# Patient Record
Sex: Male | Born: 1985 | Race: White | Hispanic: No | Marital: Single | Smoking: Current every day smoker
Health system: Southern US, Community
[De-identification: ages and names within clinical notes are randomized; demographics above are authoritative.]

## PROBLEM LIST (undated history)

## (undated) DIAGNOSIS — F1910 Other psychoactive substance abuse, uncomplicated: Secondary | ICD-10-CM

## (undated) DIAGNOSIS — F410 Panic disorder [episodic paroxysmal anxiety] without agoraphobia: Secondary | ICD-10-CM

## (undated) DIAGNOSIS — F1010 Alcohol abuse, uncomplicated: Secondary | ICD-10-CM

## (undated) DIAGNOSIS — R569 Unspecified convulsions: Secondary | ICD-10-CM

## (undated) HISTORY — PX: NO PAST SURGERIES: SHX2092

---

## 2016-05-31 ENCOUNTER — Inpatient Hospital Stay
Admission: EM | Admit: 2016-05-31 | Discharge: 2016-06-01 | DRG: 101 | Disposition: A | Discharge status: Home / Self Care | Payer: Self-pay | Attending: Internal Medicine | Admitting: Internal Medicine

## 2016-05-31 ENCOUNTER — Emergency Department: Payer: Self-pay

## 2016-05-31 DIAGNOSIS — F1290 Cannabis use, unspecified, uncomplicated: Secondary | ICD-10-CM | POA: Diagnosis present

## 2016-05-31 DIAGNOSIS — M25559 Pain in unspecified hip: Secondary | ICD-10-CM

## 2016-05-31 DIAGNOSIS — E876 Hypokalemia: Secondary | ICD-10-CM | POA: Diagnosis present

## 2016-05-31 DIAGNOSIS — D696 Thrombocytopenia, unspecified: Secondary | ICD-10-CM | POA: Diagnosis present

## 2016-05-31 DIAGNOSIS — G40909 Epilepsy, unspecified, not intractable, without status epilepticus: Principal | ICD-10-CM | POA: Diagnosis present

## 2016-05-31 DIAGNOSIS — G9389 Other specified disorders of brain: Secondary | ICD-10-CM | POA: Diagnosis present

## 2016-05-31 DIAGNOSIS — R569 Unspecified convulsions: Secondary | ICD-10-CM

## 2016-05-31 DIAGNOSIS — T1490XS Injury, unspecified, sequela: Secondary | ICD-10-CM

## 2016-05-31 DIAGNOSIS — F1410 Cocaine abuse, uncomplicated: Secondary | ICD-10-CM | POA: Diagnosis present

## 2016-05-31 DIAGNOSIS — Y9351 Activity, roller skating (inline) and skateboarding: Secondary | ICD-10-CM

## 2016-05-31 DIAGNOSIS — F1010 Alcohol abuse, uncomplicated: Secondary | ICD-10-CM | POA: Diagnosis present

## 2016-05-31 DIAGNOSIS — F1910 Other psychoactive substance abuse, uncomplicated: Secondary | ICD-10-CM | POA: Diagnosis present

## 2016-05-31 DIAGNOSIS — G259 Extrapyramidal and movement disorder, unspecified: Secondary | ICD-10-CM

## 2016-05-31 DIAGNOSIS — F17200 Nicotine dependence, unspecified, uncomplicated: Secondary | ICD-10-CM | POA: Diagnosis present

## 2016-05-31 HISTORY — DX: Panic disorder (episodic paroxysmal anxiety): F41.0

## 2016-05-31 HISTORY — DX: Other psychoactive substance abuse, uncomplicated: F19.10

## 2016-05-31 HISTORY — DX: Alcohol abuse, uncomplicated: F10.10

## 2016-05-31 HISTORY — DX: Unspecified convulsions: R56.9

## 2016-05-31 LAB — BASIC METABOLIC PANEL
Anion gap: 6 (ref 5–15)
BUN: 7 mg/dL (ref 6–20)
CHLORIDE: 104 mmol/L (ref 101–111)
CO2: 27 mmol/L (ref 22–32)
CREATININE: 0.69 mg/dL (ref 0.61–1.24)
Calcium: 9 mg/dL (ref 8.9–10.3)
GFR calc Af Amer: >60 mL/min (ref >60)
Glucose, Bld: 125 mg/dL — ABNORMAL HIGH (ref 65–99)
POTASSIUM: 3.6 mmol/L (ref 3.5–5.1)
Sodium: 137 mmol/L (ref 135–145)

## 2016-05-31 LAB — URINE DRUG SCREEN, QUALITATIVE (ARMC ONLY)
Amphetamines, Ur Screen: NOT DETECTED
BARBITURATES, UR SCREEN: NOT DETECTED
Benzodiazepine, Ur Scrn: NOT DETECTED
CANNABINOID 50 NG, UR ~~LOC~~: NOT DETECTED
COCAINE METABOLITE, UR ~~LOC~~: POSITIVE — AB
MDMA (ECSTASY) UR SCREEN: NOT DETECTED
METHADONE SCREEN, URINE: NOT DETECTED
Opiate, Ur Screen: NOT DETECTED
Phencyclidine (PCP) Ur S: NOT DETECTED
TRICYCLIC, UR SCREEN: NOT DETECTED

## 2016-05-31 LAB — URINALYSIS COMPLETE WITH MICROSCOPIC (ARMC ONLY)
BACTERIA UA: NONE SEEN
BILIRUBIN URINE: NEGATIVE
GLUCOSE, UA: NEGATIVE mg/dL
KETONES UR: NEGATIVE mg/dL
LEUKOCYTES UA: NEGATIVE
NITRITE: NEGATIVE
Protein, ur: 30 mg/dL — AB
SPECIFIC GRAVITY, URINE: 1.012 (ref 1.005–1.030)
pH: 5 (ref 5.0–8.0)

## 2016-05-31 LAB — CBC
HEMATOCRIT: 42.7 % (ref 40.0–52.0)
HEMOGLOBIN: 15 g/dL (ref 13.0–18.0)
MCH: 34.1 pg — AB (ref 26.0–34.0)
MCHC: 35.1 g/dL (ref 32.0–36.0)
MCV: 97.3 fL (ref 80.0–100.0)
Platelets: 70 10*3/uL — ABNORMAL LOW (ref 150–440)
RBC: 4.39 MIL/uL — ABNORMAL LOW (ref 4.40–5.90)
RDW: 13.4 % (ref 11.5–14.5)
WBC: 8.2 10*3/uL (ref 3.8–10.6)

## 2016-05-31 LAB — ETHANOL: Alcohol, Ethyl (B): <5 mg/dL (ref <5)

## 2016-05-31 MED ORDER — ACETAMINOPHEN 500 MG PO TABS
1000.0000 mg | ORAL_TABLET | Freq: Once | ORAL | Status: AC
  Administered 2016-05-31: 1000 mg via ORAL
  Filled 2016-05-31: qty 2

## 2016-05-31 MED ORDER — THIAMINE HCL 100 MG/ML IJ SOLN
100.0000 mg | Freq: Once | INTRAMUSCULAR | Status: AC
  Administered 2016-05-31: 100 mg via INTRAVENOUS
  Filled 2016-05-31: qty 2

## 2016-05-31 MED ORDER — CHLORDIAZEPOXIDE HCL 25 MG PO CAPS
50.0000 mg | ORAL_CAPSULE | Freq: Once | ORAL | Status: AC
Start: 2016-05-31 — End: 2016-05-31
  Administered 2016-05-31: 50 mg via ORAL
  Filled 2016-05-31: qty 2

## 2016-05-31 MED ORDER — SODIUM CHLORIDE 0.9 % IV BOLUS (SEPSIS)
1000.0000 mL | Freq: Once | INTRAVENOUS | Status: AC
  Administered 2016-05-31: 1000 mL via INTRAVENOUS

## 2016-05-31 NOTE — ED Provider Notes (Signed)
[facility] Emergency Department Provider Note  ____________________________________________  Time seen: Approximately 8:00 PM  I have reviewed the triage vital signs and the nursing notes.   HISTORY  Chief Complaint Seizures   HPI Vincent Richards is a 30 y.o. male with a history of alcohol abuse and presents for evaluation of seizure. Patient reports that he was standing in line at a restaurant when he had a witnessed tonic-clonic seizure. Patient reports one prior episode of seizure many months ago in the setting of alcohol intoxication. Patient reports that he drinks 4-6 beers a day for more than 7 years. He reports 2 or 3 beers earlier today. He denies tremors, nausea, vomiting. He has never been seen for the prior episode of seizure. He is complaining of pain in the right side of his head since the episode of passing out. Also complaining of pain in bilateral hips. Patient also endorses use of marijuana, and snorting cocaine and crack. Last use was 2 days ago. He denies IV drug use.    Past Medical History:  Diagnosis Date  . Panic anxiety syndrome   . Seizures (HCC)     There are no active problems to display for this patient.   History reviewed. No pertinent surgical history.  Prior to Admission medications   Not on File    Allergies Review of patient's allergies indicates no known allergies.  No family history on file.  Social History Social History  Substance Use Topics  . Smoking status: Current Every Day Smoker  . Smokeless tobacco: Not on file  . Alcohol use Yes    Review of Systems  Constitutional: Negative for fever. + head trauma Eyes: Negative for visual changes. ENT: Negative for sore throat. Cardiovascular: Negative for chest pain. Respiratory: Negative for shortness of breath. Gastrointestinal: Negative for abdominal pain, vomiting or diarrhea. Genitourinary: Negative for dysuria. Musculoskeletal: Negative for back  pain. + b/l hip pain Skin: Negative for rash. Neurological: Negative for headaches, weakness or numbness. + seizure  ____________________________________________   PHYSICAL EXAM:  VITAL SIGNS: ED Triage Vitals  Enc Vitals Group     BP 05/31/16 1903 (!) 148/81     Pulse Rate 05/31/16 1903 73     Resp 05/31/16 1903 16     Temp 05/31/16 1903 97.9 F (36.6 C)     Temp Source 05/31/16 1903 Oral     SpO2 05/31/16 1903 100 %     Weight 05/31/16 1904 155 lb (70.3 kg)     Height 05/31/16 1904 5\' 11"  (1.803 m)     Head Circumference --      Peak Flow --      Pain Score 05/31/16 1945 5     Pain Loc --      Pain Edu? --      Excl. in GC? --     Constitutional: Alert and oriented. Well appearing and in no apparent distress. HEENT:      Head: Normocephalic and atraumatic.         Eyes: Conjunctivae are normal. Sclera is non-icteric. EOMI. PERRL      Mouth/Throat: Mucous membranes are moist.       Neck: Supple with no signs of meningismus. Cardiovascular: Regular rate and rhythm. No murmurs, gallops, or rubs. 2+ symmetrical distal pulses are present in all extremities. No JVD. Respiratory: Normal respiratory effort. Lungs are clear to auscultation bilaterally. No wheezes, crackles, or rhonchi.  Gastrointestinal: Soft, non tender, and non distended with positive bowel sounds.  No rebound or guarding. Genitourinary: No CVA tenderness. Musculoskeletal: Nontender with normal range of motion in all extremities. No edema, cyanosis, or erythema of extremities. Neurologic: Normal speech and language. A & O x3, PERRL, no nystagmus, CN II-XII intact, motor testing reveals good tone and bulk throughout. There is no evidence of pronator drift or dysmetria. Muscle strength is 5/5 throughout. Deep tendon reflexes are 2+ throughout with downgoing toes. Sensory examination is intact. Gait deferred Skin: Skin is warm, dry and intact. No rash noted. Psychiatric: Mood and affect are normal. Speech and  behavior are normal.  ____________________________________________   LABS (all labs ordered are listed, but only abnormal results are displayed)  Labs Reviewed  CBC - Abnormal; Notable for the following:       Result Value   RBC 4.39 (*)    MCH 34.1 (*)    Platelets 70 (*)    All other components within normal limits  BASIC METABOLIC PANEL - Abnormal; Notable for the following:    Glucose, Bld 125 (*)    All other components within normal limits  URINALYSIS COMPLETEWITH MICROSCOPIC ([facility] ONLY) - Abnormal; Notable for the following:    Color, Urine YELLOW (*)    APPearance CLEAR (*)    Hgb urine dipstick 1+ (*)    Protein, ur 30 (*)    Squamous Epithelial / LPF 0-5 (*)    All other components within normal limits  ETHANOL  URINE DRUG SCREEN, QUALITATIVE ([facility] ONLY)   ____________________________________________  EKG  ED ECG REPORT I, Nita Sicklearolina Kaybree Williams, the attending physician, personally viewed and interpreted this ECG.  Normal sinus rhythm, rate of 76, right bundle branch block, normal QTC, normal axis, no ST elevations or depressions. ____________________________________________  RADIOLOGY  Head and c-spine CT: 1. Right lateral scalp hematoma without skull fracture or intracranial hemorrhage. 2. No cervical spine fracture or subluxation. 3. Reversal of the normal cervical lordosis. 4. Very minimal degenerative changes at the C5-6 and C6-7 levels.  XR b/l hip: negative ____________________________________________   PROCEDURES  Procedure(s) performed: None Procedures Critical Care performed:  None ____________________________________________   INITIAL IMPRESSION / ASSESSMENT AND PLAN / ED COURSE   30 y.o. male with a history of alcohol abuse and presents for evaluation of seizure. This is patient's second seizure while intoxicated. Has never seen a neurologist for it. We'll get head CT and CT cervical spine to rule out an acute injuries. We'll get bilateral  hip x-rays. We'll give IV fluids. Check blood work, drug screen, ethanol level. We'll discuss patient with neurology.  Clinical Course  Comment By Time  [facility] who evaluated patient and the imaging. He says that he sees a lucency in the patient's basal ganglia on head CT and recommended an MRI, admission for EEG and neurology evaluation. Recommend holding off on antiepileptic medications at this time. Nita Sicklearolina Dontel Harshberger, MD 10/05 2139    Pertinent labs & imaging results that were available during my care of the patient were reviewed by me and considered in my medical decision making (see chart for details).    ____________________________________________   FINAL CLINICAL IMPRESSION(S) / ED DIAGNOSES  Final diagnoses:  Hip pain  Seizure (HCC)  Alcohol abuse      NEW MEDICATIONS STARTED DURING THIS VISIT:  New Prescriptions   No medications on file     Note:  This document was prepared using Dragon voice recognition software and may include unintentional dictation errors.    Nita Sicklearolina Marcello Tuzzolino, MD 05/31/16 2146

## 2016-05-31 NOTE — H&P (Addendum)
[facility] [place] at [facility]   PATIENT NAME: Vincent Richards    MR#:  [id number]  DATE OF BIRTH:  06/27/1986  DATE OF ADMISSION:  05/31/2016  PRIMARY CARE PHYSICIAN: Pcp Not In System   REQUESTING/REFERRING PHYSICIAN: Don Perking, MD  CHIEF COMPLAINT:   Chief Complaint  Patient presents with  . Seizures    HISTORY OF PRESENT ILLNESS:  Vincent Richards  is a 30 y.o. male who presents with Seizure. Patient states he has had another seizure in the past. He does not follow with neurology and is not on antiepileptic medications. His prior seizure was associated with alcohol withdrawal. He states that he was out to dinner with family tonight and does not remember anything of what happened, but woke up in the ambulance almost at the hospital already. He had some persistent postictal "foggy mentation" even after arriving in the ED. Telemetry neurologist reviewed his chart and felt he saw an area of lucency in his basal ganglia on his CT scan. He recommended inpatient admission with seizure workup with EEG, neurology consult, as well as MRI for further clarification.  PAST MEDICAL HISTORY:   Past Medical History:  Diagnosis Date  . ETOH abuse   . Panic anxiety syndrome   . Seizures (HCC)   . Substance abuse     PAST SURGICAL HISTORY:   Past Surgical History:  Procedure Laterality Date  . NO PAST SURGERIES      SOCIAL HISTORY:   Social History  Substance Use Topics  . Smoking status: Current Every Day Smoker  . Smokeless tobacco: Not on file  . Alcohol use 16.8 oz/week    28 Cans of beer per week    FAMILY HISTORY:  No family history on file.  DRUG ALLERGIES:  No Known Allergies  MEDICATIONS AT HOME:   Prior to Admission medications   Not on File    REVIEW OF SYSTEMS:  Review of Systems  Constitutional: Negative for chills, fever, malaise/fatigue and weight loss.  HENT: Negative for ear pain, hearing loss and tinnitus.   Eyes: Negative for  blurred vision, double vision, pain and redness.  Respiratory: Negative for cough, hemoptysis and shortness of breath.   Cardiovascular: Negative for chest pain, palpitations, orthopnea and leg swelling.  Gastrointestinal: Negative for abdominal pain, constipation, diarrhea, nausea and vomiting.  Genitourinary: Negative for dysuria, frequency and hematuria.  Musculoskeletal: Negative for back pain, joint pain and neck pain.  Skin:       No acne, rash, or lesions  Neurological: Positive for seizures. Negative for dizziness, tremors, focal weakness and weakness.  Endo/Heme/Allergies: Negative for polydipsia. Does not bruise/bleed easily.  Psychiatric/Behavioral: Negative for depression. The patient is not nervous/anxious and does not have insomnia.      VITAL SIGNS:   Vitals:   05/31/16 2200 05/31/16 2215 05/31/16 2230 05/31/16 2245  BP:  (!) 149/80 135/85 (!) 144/78  Pulse: 65 64 64 89  Resp: 14 14 16 17   Temp:      TempSrc:      SpO2: 98% 99% 98% 98%  Weight:      Height:       Wt Readings from Last 3 Encounters:  05/31/16 70.3 kg (155 lb)    PHYSICAL EXAMINATION:  Physical Exam  Vitals reviewed. Constitutional: He is oriented to person, place, and time. He appears well-developed and well-nourished. No distress.  HENT:  Head: Normocephalic and atraumatic.  Mouth/Throat: Oropharynx is clear and moist.  Eyes: Conjunctivae and EOM are  normal. Pupils are equal, round, and reactive to light. No scleral icterus.  Neck: Normal range of motion. Neck supple. No JVD present. No thyromegaly present.  Cardiovascular: Normal rate, regular rhythm and intact distal pulses.  Exam reveals no gallop and no friction rub.   No murmur heard. Respiratory: Effort normal and breath sounds normal. No respiratory distress. He has no wheezes. He has no rales.  GI: Soft. Bowel sounds are normal. He exhibits no distension. There is no tenderness.  Musculoskeletal: Normal range of motion. He exhibits no  edema.  No arthritis, no gout  Lymphadenopathy:    He has no cervical adenopathy.  Neurological: He is alert and oriented to person, place, and time. No cranial nerve deficit.  No dysarthria, no aphasia  Skin: Skin is warm and dry. No rash noted. No erythema.  Psychiatric: He has a normal mood and affect. His behavior is normal. Judgment and thought content normal.    LABORATORY PANEL:   CBC  Recent Labs Lab 05/31/16 2059  WBC 8.2  HGB 15.0  HCT 42.7  PLT 70*   ------------------------------------------------------------------------------------------------------------------  Chemistries   Recent Labs Lab 05/31/16 2059  NA 137  K 3.6  CL 104  CO2 27  GLUCOSE 125*  BUN 7  CREATININE 0.69  CALCIUM 9.0   ------------------------------------------------------------------------------------------------------------------  Cardiac Enzymes No results for input(s): TROPONINI in the last 168 hours. ------------------------------------------------------------------------------------------------------------------  RADIOLOGY:  Ct Head Wo Contrast  Result Date: 05/31/2016 CLINICAL DATA:  Seizure today. EXAM: CT HEAD WITHOUT CONTRAST CT CERVICAL SPINE WITHOUT CONTRAST TECHNIQUE: Multidetector CT imaging of the head and cervical spine was performed following the standard protocol without intravenous contrast. Multiplanar CT image reconstructions of the cervical spine were also generated. COMPARISON:  None. FINDINGS: CT HEAD FINDINGS Brain: No evidence of acute infarction, hemorrhage, hydrocephalus, extra-axial collection or mass lesion/mass effect. Vascular: No hyperdense vessel or unexpected calcification. Skull: Normal. Negative for fracture or focal lesion. Sinuses/Orbits: No acute finding. Other: Right lateral scalp hematoma. CT CERVICAL SPINE FINDINGS Alignment: Reversal of the normal lordosis.  No subluxations. Skull base and vertebrae: No acute fracture. No primary bone lesion or  focal pathologic process. Soft tissues and spinal canal: No prevertebral fluid or swelling. No visible canal hematoma. Disc levels: Minimal may anterior spur formation at the C5-6 and C6-7 levels. Upper chest: Clear lung apices. Other: None. IMPRESSION: 1. Right lateral scalp hematoma without skull fracture or intracranial hemorrhage. 2. No cervical spine fracture or subluxation. 3. Reversal of the normal cervical lordosis. 4. Very minimal degenerative changes at the C5-6 and C6-7 levels. Electronically Signed   By: Beckie Salts M.D.   On: 05/31/2016 20:52   Ct Cervical Spine Wo Contrast  Result Date: 05/31/2016 CLINICAL DATA:  Seizure today. EXAM: CT HEAD WITHOUT CONTRAST CT CERVICAL SPINE WITHOUT CONTRAST TECHNIQUE: Multidetector CT imaging of the head and cervical spine was performed following the standard protocol without intravenous contrast. Multiplanar CT image reconstructions of the cervical spine were also generated. COMPARISON:  None. FINDINGS: CT HEAD FINDINGS Brain: No evidence of acute infarction, hemorrhage, hydrocephalus, extra-axial collection or mass lesion/mass effect. Vascular: No hyperdense vessel or unexpected calcification. Skull: Normal. Negative for fracture or focal lesion. Sinuses/Orbits: No acute finding. Other: Right lateral scalp hematoma. CT CERVICAL SPINE FINDINGS Alignment: Reversal of the normal lordosis.  No subluxations. Skull base and vertebrae: No acute fracture. No primary bone lesion or focal pathologic process. Soft tissues and spinal canal: No prevertebral fluid or swelling. No visible canal hematoma. Disc levels: Minimal  may anterior spur formation at the C5-6 and C6-7 levels. Upper chest: Clear lung apices. Other: None. IMPRESSION: 1. Right lateral scalp hematoma without skull fracture or intracranial hemorrhage. 2. No cervical spine fracture or subluxation. 3. Reversal of the normal cervical lordosis. 4. Very minimal degenerative changes at the C5-6 and C6-7 levels.  Electronically Signed   By: Beckie Salts M.D.   On: 05/31/2016 20:52   Dg Hip Unilat With Pelvis 2-3 Views Left  Result Date: 05/31/2016 CLINICAL DATA:  Bilateral hip pain following a seizure today. EXAM: DG HIP (WITH OR WITHOUT PELVIS) 2-3V LEFT COMPARISON:  None. FINDINGS: There is no evidence of hip fracture or dislocation. There is no evidence of arthropathy or other focal bone abnormality. IMPRESSION: Normal examination. Electronically Signed   By: Beckie Salts M.D.   On: 05/31/2016 20:53   Dg Hip Unilat With Pelvis 2-3 Views Right  Result Date: 05/31/2016 CLINICAL DATA:  Bilateral hip pain following a seizure today. EXAM: DG HIP (WITH OR WITHOUT PELVIS) 2-3V RIGHT COMPARISON:  None. FINDINGS: There is no evidence of hip fracture or dislocation. There is no evidence of arthropathy or other focal bone abnormality. IMPRESSION: Normal examination. Electronically Signed   By: Beckie Salts M.D.   On: 05/31/2016 20:53    EKG:   Orders placed or performed during the hospital encounter of 05/31/16  . EKG 12-Lead  . EKG 12-Lead    IMPRESSION AND PLAN:  Principal Problem:   Seizure [facility]) - telemetry neurologist did not recommend starting antiepileptic medications tonight. We have ordered an EEG, MRI brain, neurology consult. Patient seems to be back to baseline mentation of this time. Active Problems:   ETOH abuse - significant alcohol use, prior seizures related to alcohol withdrawal. Does not seem to be in withdrawal at this time. We will have him on CIWA protocol   Substance abuse - patient admits to multi-substance use. He is cocaine positive on his tox screen, seems to be unlikely related to his seizure. He could likely benefit from connection to substance counseling on discharge.  All the records are reviewed and case discussed with ED provider. Management plans discussed with the patient and/or family.  DVT PROPHYLAXIS: SubQ lovenox  GI PROPHYLAXIS: None  ADMISSION STATUS:  Inpatient  CODE STATUS: Full Code Status History    This patient does not have a recorded code status. Please follow your organizational policy for patients in this situation.      TOTAL TIME TAKING CARE OF THIS PATIENT: 45 minutes.    Vincent Richards FIELDING 05/31/2016, 11:52 PM  [facility]  Office  [phone]  CC: Primary care physician; Pcp Not In System

## 2016-05-31 NOTE — ED Notes (Signed)
Family at bedside. CIWA = 12

## 2016-05-31 NOTE — ED Triage Notes (Signed)
Pt BIB EMS, reports witnessed seizure at [employer], witnesses report lasted approx 2-3 minutes. Pt had 1 seizure previously a few months ago due to alcohol withdrawls. Denies detoz at this time, states he drank 2-3 drinks today

## 2016-06-01 ENCOUNTER — Inpatient Hospital Stay: Payer: Self-pay

## 2016-06-01 DIAGNOSIS — R569 Unspecified convulsions: Secondary | ICD-10-CM

## 2016-06-01 DIAGNOSIS — F1910 Other psychoactive substance abuse, uncomplicated: Secondary | ICD-10-CM | POA: Diagnosis present

## 2016-06-01 LAB — BASIC METABOLIC PANEL
ANION GAP: 7 (ref 5–15)
BUN: 6 mg/dL (ref 6–20)
CHLORIDE: 107 mmol/L (ref 101–111)
CO2: 24 mmol/L (ref 22–32)
Calcium: 8.7 mg/dL — ABNORMAL LOW (ref 8.9–10.3)
Creatinine, Ser: 0.68 mg/dL (ref 0.61–1.24)
GFR calc non Af Amer: >60 mL/min (ref >60)
Glucose, Bld: 96 mg/dL (ref 65–99)
POTASSIUM: 3.3 mmol/L — AB (ref 3.5–5.1)
Sodium: 138 mmol/L (ref 135–145)

## 2016-06-01 LAB — CBC
HEMATOCRIT: 43.8 % (ref 40.0–52.0)
HEMOGLOBIN: 14.9 g/dL (ref 13.0–18.0)
MCH: 33.8 pg (ref 26.0–34.0)
MCHC: 34 g/dL (ref 32.0–36.0)
MCV: 99.4 fL (ref 80.0–100.0)
Platelets: 68 10*3/uL — ABNORMAL LOW (ref 150–440)
RBC: 4.41 MIL/uL (ref 4.40–5.90)
RDW: 13.7 % (ref 11.5–14.5)
WBC: 6.4 10*3/uL (ref 3.8–10.6)

## 2016-06-01 MED ORDER — SODIUM CHLORIDE 0.9% FLUSH
3.0000 mL | Freq: Two times a day (BID) | INTRAVENOUS | Status: DC
  Administered 2016-06-01 (×2): 3 mL via INTRAVENOUS

## 2016-06-01 MED ORDER — LORAZEPAM 2 MG/ML IJ SOLN
0.0000 mg | Freq: Four times a day (QID) | INTRAMUSCULAR | Status: DC
Start: 2016-06-01 — End: 2016-06-01
  Administered 2016-06-01: 2 mg via INTRAVENOUS

## 2016-06-01 MED ORDER — LORAZEPAM 2 MG/ML IJ SOLN
0.0000 mg | Freq: Two times a day (BID) | INTRAMUSCULAR | Status: DC
Start: 2016-06-03 — End: 2016-06-01

## 2016-06-01 MED ORDER — ENOXAPARIN SODIUM 40 MG/0.4ML ~~LOC~~ SOLN: 40.0000 mg | SUBCUTANEOUS | Status: DC

## 2016-06-01 MED ORDER — LEVETIRACETAM 500 MG PO TABS: 500.0000 mg | ORAL_TABLET | Freq: Two times a day (BID) | ORAL | Status: DC

## 2016-06-01 MED ORDER — LORAZEPAM 1 MG PO TABS: ORAL_TABLET | ORAL | 0 refills | Status: AC

## 2016-06-01 MED ORDER — ONDANSETRON HCL 4 MG/2ML IJ SOLN: 4.0000 mg | Freq: Four times a day (QID) | INTRAMUSCULAR | Status: DC | PRN

## 2016-06-01 MED ORDER — FOLIC ACID 1 MG PO TABS: 1.0000 mg | ORAL_TABLET | Freq: Every day | ORAL | 0 refills | Status: AC

## 2016-06-01 MED ORDER — LEVETIRACETAM 500 MG PO TABS: 500.0000 mg | ORAL_TABLET | Freq: Two times a day (BID) | ORAL | 0 refills | Status: AC

## 2016-06-01 MED ORDER — POTASSIUM CHLORIDE CRYS ER 20 MEQ PO TBCR
40.0000 meq | EXTENDED_RELEASE_TABLET | Freq: Once | ORAL | Status: AC
  Administered 2016-06-01: 14:00:00 40 meq via ORAL
  Filled 2016-06-01: qty 2

## 2016-06-01 MED ORDER — LEVETIRACETAM 500 MG/5ML IV SOLN
500.0000 mg | Freq: Once | INTRAVENOUS | Status: AC
  Administered 2016-06-01: 13:00:00 500 mg via INTRAVENOUS
  Filled 2016-06-01: qty 5

## 2016-06-01 MED ORDER — SODIUM CHLORIDE 0.9 % IV SOLN
INTRAVENOUS | Status: DC
  Administered 2016-06-01: 02:00:00 via INTRAVENOUS

## 2016-06-01 MED ORDER — THIAMINE HCL 100 MG PO TABS: 100.0000 mg | ORAL_TABLET | Freq: Every day | ORAL | 0 refills | Status: AC

## 2016-06-01 MED ORDER — LORAZEPAM 1 MG PO TABS
1.0000 mg | ORAL_TABLET | Freq: Four times a day (QID) | ORAL | Status: DC | PRN
  Filled 2016-06-01: qty 1

## 2016-06-01 MED ORDER — LORAZEPAM 1 MG PO TABS
1.0000 mg | ORAL_TABLET | Freq: Four times a day (QID) | ORAL | Status: DC
  Administered 2016-06-01: 1 mg via ORAL

## 2016-06-01 MED ORDER — ACETAMINOPHEN 650 MG RE SUPP: 650.0000 mg | Freq: Four times a day (QID) | RECTAL | Status: DC | PRN

## 2016-06-01 MED ORDER — ACETAMINOPHEN 325 MG PO TABS: 650.0000 mg | ORAL_TABLET | Freq: Four times a day (QID) | ORAL | Status: DC | PRN

## 2016-06-01 MED ORDER — VITAMIN B-1 100 MG PO TABS
100.0000 mg | ORAL_TABLET | Freq: Every day | ORAL | Status: DC
  Administered 2016-06-01: 13:00:00 100 mg via ORAL
  Filled 2016-06-01: qty 1

## 2016-06-01 MED ORDER — ONDANSETRON HCL 4 MG PO TABS: 4.0000 mg | ORAL_TABLET | Freq: Four times a day (QID) | ORAL | Status: DC | PRN

## 2016-06-01 MED ORDER — THIAMINE HCL 100 MG/ML IJ SOLN: 100.0000 mg | Freq: Every day | INTRAMUSCULAR | Status: DC

## 2016-06-01 MED ORDER — ADULT MULTIVITAMIN W/MINERALS CH: 1.0000 | ORAL_TABLET | Freq: Every day | ORAL | 0 refills | Status: AC

## 2016-06-01 MED ORDER — PNEUMOCOCCAL VAC POLYVALENT 25 MCG/0.5ML IJ INJ: 0.5000 mL | INJECTION | INTRAMUSCULAR | Status: DC

## 2016-06-01 MED ORDER — ADULT MULTIVITAMIN W/MINERALS CH
1.0000 | ORAL_TABLET | Freq: Every day | ORAL | Status: DC
  Administered 2016-06-01: 1 via ORAL
  Filled 2016-06-01: qty 1

## 2016-06-01 MED ORDER — FOLIC ACID 1 MG PO TABS
1.0000 mg | ORAL_TABLET | Freq: Every day | ORAL | Status: DC
  Administered 2016-06-01: 13:00:00 1 mg via ORAL
  Filled 2016-06-01: qty 1

## 2016-06-01 MED ORDER — LORAZEPAM 2 MG/ML IJ SOLN
1.0000 mg | Freq: Four times a day (QID) | INTRAMUSCULAR | Status: DC | PRN
  Filled 2016-06-01: qty 1

## 2016-06-01 NOTE — Care Management (Signed)
Patient admitted with sz.  History of significant etoh abuse.  Patient states that he is from [place], and  Will be traveling back home after discharge with his family.  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 Patient does not have a PCP, and reports he does not take any home medications.  Patient states that he plans on getting established with PCP, after he returns home.  Patient to discharge with ativan and keppra.  I have provided the patient with coupons from goodrx for each of these.  Ativan $6.06, and Keppra $26.76.  Patient states that he will be able to afford these.  I explained to the patient how to use the goodrx site should he need to use it in the future.  RNCM signing off.

## 2016-06-01 NOTE — Progress Notes (Signed)
Pt for discharge home alert/ dr Rebbeca Paulreynalds saw  After  Mri and eeg this am.  Sl iv d/cd.  Dr weiting saw.  Instructions for discharge  Discussed with pt.  presc given and  meds discussed. Diet activity and f/u discussed.  [place] d/cd at this time w/o c/o/ no s/s seizure activity.

## 2016-06-01 NOTE — Discharge Instructions (Addendum)
No Driving with diagnosis of seizure.

## 2016-06-01 NOTE — Consult Note (Signed)
Reason for Consult:Seizure Referring Physician: Wiet[place]  CC: Seizure  HPI: Vincent Richards is an 30 y.o. male who was standing in line at a restaurant when he had a witnessed tonic-clonic seizure. Patient reports one prior episode of seizure a few months ago in the setting of alcohol intoxication. Patient reports that he drinks 4-6 beers a day for more than 7 years. He reports 2 or 3 beers on the day prior to admission. Patient also endorsed the use of marijuana, and snorting cocaine and crack. Last use was 2 days prior to presentation.  Patient had a head injury from a skateboard in April of last year.  Was admitted to the ICU with associated ICH.  Mother reports has had some personality changes since that time.    Past Medical History:  Diagnosis Date  . ETOH abuse   . Panic anxiety syndrome   . Seizures (HCC)   . Substance abuse     Past Surgical History:  Procedure Laterality Date  . NO PAST SURGERIES     Family history: Mother alive and well.  No family history of seizures.    Social History:  reports that he has been smoking.  He uses smokeless tobacco. He reports that he drinks about 16.8 oz of alcohol per week . He reports that he uses drugs, including Marijuana, "Crack" cocaine, and Cocaine.  No Known Allergies  Medications:  I have reviewed the patient's current medications. Prior to Admission:  No prescriptions prior to admission.   Scheduled: . enoxaparin (LOVENOX) injection  40 mg Subcutaneous Q24H  . folic acid  1 mg Oral Daily  . levETIRAcetam  500 mg Oral BID  . LORazepam  0-4 mg Intravenous Q6H   Followed by  . [START ON 06/03/2016] LORazepam  0-4 mg Intravenous Q12H  . LORazepam  1 mg Oral Q6H  . multivitamin with minerals  1 tablet Oral Daily  . [START ON 06/02/2016] pneumococcal 23 valent vaccine  0.5 mL Intramuscular Tomorrow-1000  . sodium chloride flush  3 mL Intravenous Q12H  . thiamine  100 mg Oral Daily   Or  . thiamine  100 mg Intravenous Daily     ROS: History obtained from the patient  General ROS: negative for - chills, fatigue, fever, night sweats, weight gain or weight loss Psychological ROS: negative for - behavioral disorder, hallucinations, memory difficulties, mood swings or suicidal ideation Ophthalmic ROS: negative for - blurry vision, double vision, eye pain or loss of vision ENT ROS: negative for - epistaxis, nasal discharge, oral lesions, sore throat, tinnitus or vertigo Allergy and Immunology ROS: negative for - hives or itchy/watery eyes Hematological and Lymphatic ROS: negative for - bleeding problems, bruising or swollen lymph nodes Endocrine ROS: negative for - galactorrhea, hair pattern changes, polydipsia/polyuria or temperature intolerance Respiratory ROS: negative for - cough, hemoptysis, shortness of breath or wheezing Cardiovascular ROS: negative for - chest pain, dyspnea on exertion, edema or irregular heartbeat Gastrointestinal ROS: negative for - abdominal pain, diarrhea, hematemesis, nausea/vomiting or stool incontinence Genito-Urinary ROS: negative for - dysuria, hematuria, incontinence or urinary frequency/urgency Musculoskeletal ROS: left hip pain Neurological ROS: as noted in HPI Dermatological ROS: negative for rash and skin lesion changes  Physical Examination: Blood pressure 135/83, pulse 60, temperature 97.8 F (36.6 C), temperature source Oral, resp. rate 18, height 5\' 11"  (1.803 m), weight 70.3 kg (155 lb), SpO2 98 %.  HEENT-  Normocephalic, no lesions, without obvious abnormality.  Normal external eye and conjunctiva.  Normal TM's bilaterally.  Normal auditory canals and external ears. Normal external nose, mucus membranes and septum.  Normal pharynx. + tongue bite Cardiovascular- S1, S2 normal, pulses palpable throughout   Lungs- chest clear, no wheezing, rales, normal symmetric air entry Abdomen- soft, non-tender; bowel sounds normal; no masses,  no organomegaly Extremities- no  edema Lymph-no adenopathy palpable Musculoskeletal-no joint tenderness, deformity or swelling Skin-warm and dry, no hyperpigmentation, vitiligo, or suspicious lesions  Neurological Examination Mental Status: Alert, oriented, thought content appropriate.  Speech fluent without evidence of aphasia.  Able to follow 3 step commands without difficulty. Cranial Nerves: II: Discs flat bilaterally; Visual fields grossly normal, pupils equal, round, reactive to light and accommodation III,IV, VI: ptosis not present, extra-ocular motions intact bilaterally V,VII: smile symmetric, facial light touch sensation normal bilaterally VIII: hearing normal bilaterally IX,X: gag reflex present XI: bilateral shoulder shrug XII: midline tongue extension Motor: Right : Upper extremity   5/5    Left:     Upper extremity   5/5  Lower extremity   5/5     Lower extremity   5/5 Tone and bulk:normal tone throughout; no atrophy noted Sensory: Pinprick and light touch intact throughout, bilaterally Deep Tendon Reflexes: 2+ and symmetric throughout Plantars: Right: downgoing   Left: downgoing Cerebellar: normal finger-to-nose, normal rapid alternating movements and normal heel-to-shin test Gait: not tested due to safety concerns    Laboratory Studies:   Basic Metabolic Panel:  Recent Labs Lab 05/31/16 2059 06/01/16 0413  NA 137 138  K 3.6 3.3*  CL 104 107  CO2 27 24  GLUCOSE 125* 96  BUN 7 6  CREATININE 0.69 0.68  CALCIUM 9.0 8.7*    Liver Function Tests: No results for input(s): AST, ALT, ALKPHOS, BILITOT, PROT, ALBUMIN in the last 168 hours. No results for input(s): LIPASE, AMYLASE in the last 168 hours. No results for input(s): AMMONIA in the last 168 hours.  CBC:  Recent Labs Lab 05/31/16 2059 06/01/16 0413  WBC 8.2 6.4  HGB 15.0 14.9  HCT 42.7 43.8  MCV 97.3 99.4  PLT 70* 68*    Cardiac Enzymes: No results for input(s): CKTOTAL, CKMB, CKMBINDEX, TROPONINI in the last 168  hours.  BNP: Invalid input(s): POCBNP  CBG: No results for input(s): GLUCAP in the last 168 hours.  Microbiology: No results found for this or any previous visit.  Coagulation Studies: No results for input(s): LABPROT, INR in the last 72 hours.  Urinalysis:  Recent Labs Lab 05/31/16 2102  COLORURINE YELLOW*  LABSPEC 1.012  PHURINE 5.0  GLUCOSEU NEGATIVE  HGBUR 1+*  BILIRUBINUR NEGATIVE  KETONESUR NEGATIVE  PROTEINUR 30*  NITRITE NEGATIVE  LEUKOCYTESUR NEGATIVE    Lipid Panel:  No results found for: CHOL, TRIG, HDL, CHOLHDL, VLDL, LDLCALC  HgbA1C: No results found for: HGBA1C  Urine Drug Screen:     Component Value Date/Time   LABOPIA NONE DETECTED 05/31/2016 2102   COCAINSCRNUR POSITIVE (A) 05/31/2016 2102   LABBENZ NONE DETECTED 05/31/2016 2102   AMPHETMU NONE DETECTED 05/31/2016 2102   THCU NONE DETECTED 05/31/2016 2102   LABBARB NONE DETECTED 05/31/2016 2102    Alcohol Level:  Recent Labs Lab 05/31/16 2059  ETH <5    Other results: EKG: sinus rhythm at 76 bpm.  Imaging: Ct Head Wo Contrast  Result Date: 05/31/2016 CLINICAL DATA:  Seizure today. EXAM: CT HEAD WITHOUT CONTRAST CT CERVICAL SPINE WITHOUT CONTRAST TECHNIQUE: Multidetector CT imaging of the head and cervical spine was performed following the standard protocol without intravenous contrast. Multiplanar  CT image reconstructions of the cervical spine were also generated. COMPARISON:  None. FINDINGS: CT HEAD FINDINGS Brain: No evidence of acute infarction, hemorrhage, hydrocephalus, extra-axial collection or mass lesion/mass effect. Vascular: No hyperdense vessel or unexpected calcification. Skull: Normal. Negative for fracture or focal lesion. Sinuses/Orbits: No acute finding. Other: Right lateral scalp hematoma. CT CERVICAL SPINE FINDINGS Alignment: Reversal of the normal lordosis.  No subluxations. Skull base and vertebrae: No acute fracture. No primary bone lesion or focal pathologic process. Soft  tissues and spinal canal: No prevertebral fluid or swelling. No visible canal hematoma. Disc levels: Minimal may anterior spur formation at the C5-6 and C6-7 levels. Upper chest: Clear lung apices. Other: None. IMPRESSION: 1. Right lateral scalp hematoma without skull fracture or intracranial hemorrhage. 2. No cervical spine fracture or subluxation. 3. Reversal of the normal cervical lordosis. 4. Very minimal degenerative changes at the C5-6 and C6-7 levels. Electronically Signed   By: Beckie Salts M.D.   On: 05/31/2016 20:52   Ct Cervical Spine Wo Contrast  Result Date: 05/31/2016 CLINICAL DATA:  Seizure today. EXAM: CT HEAD WITHOUT CONTRAST CT CERVICAL SPINE WITHOUT CONTRAST TECHNIQUE: Multidetector CT imaging of the head and cervical spine was performed following the standard protocol without intravenous contrast. Multiplanar CT image reconstructions of the cervical spine were also generated. COMPARISON:  None. FINDINGS: CT HEAD FINDINGS Brain: No evidence of acute infarction, hemorrhage, hydrocephalus, extra-axial collection or mass lesion/mass effect. Vascular: No hyperdense vessel or unexpected calcification. Skull: Normal. Negative for fracture or focal lesion. Sinuses/Orbits: No acute finding. Other: Right lateral scalp hematoma. CT CERVICAL SPINE FINDINGS Alignment: Reversal of the normal lordosis.  No subluxations. Skull base and vertebrae: No acute fracture. No primary bone lesion or focal pathologic process. Soft tissues and spinal canal: No prevertebral fluid or swelling. No visible canal hematoma. Disc levels: Minimal may anterior spur formation at the C5-6 and C6-7 levels. Upper chest: Clear lung apices. Other: None. IMPRESSION: 1. Right lateral scalp hematoma without skull fracture or intracranial hemorrhage. 2. No cervical spine fracture or subluxation. 3. Reversal of the normal cervical lordosis. 4. Very minimal degenerative changes at the C5-6 and C6-7 levels. Electronically Signed   By:  Beckie Salts M.D.   On: 05/31/2016 20:52   Mr Brain Wo Contrast  Result Date: 06/01/2016 CLINICAL DATA:  Seizure.  Possible basal ganglia lesion. EXAM: MRI HEAD WITHOUT CONTRAST TECHNIQUE: Multiplanar, multiecho pulse sequences of the brain and surrounding structures were obtained without intravenous contrast. COMPARISON:  Head CT 05/31/2016 FINDINGS: Brain: There is no evidence of acute infarct, mass, midline shift, or extra-axial fluid collection. There is encephalomalacia in the anterior right frontal lobe with associated chronic blood products likely reflecting remote trauma. Dilated perivascular spaces are noted at the inferior aspects of the basal ganglia bilaterally. The mesial temporal lobe structures are symmetric and normal in appearance bilaterally. The ventricles are normal in size. Vascular: Major intracranial vascular flow voids are preserved. Skull and upper cervical spine: Unremarkable bone marrow signal. Sinuses/Orbits: Unremarkable. Other: Small right parietal scalp hematoma as seen on earlier CT. IMPRESSION: 1. No acute intracranial abnormality. 2. 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 3. Small right parietal scalp hematoma. Electronically Signed   By: Sebastian Ache M.D.   On: 06/01/2016 10:37   Dg Hip Unilat With Pelvis 2-3 Views Left  Result Date: 05/31/2016 CLINICAL DATA:  Bilateral hip pain following a seizure today. EXAM: DG HIP (WITH OR WITHOUT PELVIS) 2-3V LEFT COMPARISON:  None. FINDINGS: There is no  evidence of hip fracture or dislocation. There is no evidence of arthropathy or other focal bone abnormality. IMPRESSION: Normal examination. Electronically Signed   By: Beckie Salts M.D.   On: 05/31/2016 20:53   Dg Hip Unilat With Pelvis 2-3 Views Right  Result Date: 05/31/2016 CLINICAL DATA:  Bilateral hip pain following a seizure today. EXAM: DG HIP (WITH OR WITHOUT PELVIS) 2-3V RIGHT COMPARISON:  None. FINDINGS: There is no evidence of hip fracture or  dislocation. There is no evidence of arthropathy or other focal bone abnormality. IMPRESSION: Normal examination. Electronically Signed   By: Beckie Salts M.D.   On: 05/31/2016 20:53     Assessment/Plan: 30 year old male presenting after his second seizure.  Neurological examination unremarkable.  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  This is likely the etiology for his seizures.  ETOH and drug use are likely contributors as well in that they further decease his seizure threshold.  Anticonvulsant therapy indicated.    Recommendations: 1.  Keppra 500mg  BID.  May load with 500mg  IV now 2.  Patient to follow up with neurology as an outpatient. 3.  Seizure precautions.  Patient unable to drive, operate heavy machinery, perform activities at heights and participate in water activities until release by outpatient physician.  Thana Farr, MD Neurology [phone] 06/01/2016, 12:21 PM

## 2016-06-01 NOTE — Discharge Summary (Signed)
[facility] [place] [facility]   PATIENT Richards: Vincent Richards    MR#:  [id number]  DATE OF BIRTH:  06-Nov-1985  DATE OF ADMISSION:  05/31/2016 ADMITTING PHYSICIAN: Oralia Manis, MD  DATE OF DISCHARGE: 06/01/2016  PRIMARY CARE PHYSICIAN: Pcp Not In System    ADMISSION DIAGNOSIS:  Alcohol abuse [F10.10] Seizure (HCC) [R56.9] Hip pain [M25.559]  DISCHARGE DIAGNOSIS:  Principal Problem:   Seizure (HCC) Active Problems:   ETOH abuse   Substance abuse   SECONDARY DIAGNOSIS:   Past Medical History:  Diagnosis Date  . ETOH abuse   . Panic anxiety syndrome   . Seizures (HCC)   . Substance abuse     HOSPITAL COURSE:   1. Seizure disorder. 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 This is likely the source of his seizure. Seen by neurology and they recommended IV Keppra load and then 500 mg IV twice a day. No driving recommended 6 months at least. 2. Cocaine abuse patient states this is a one-time thing. 3. Alcohol abuse. The patient is interested in getting back to [place] today. He is given a be driven by his mother. I will give him an Ativan taper. No imaging studies were done for his liver. The liver enzymes were sent off. No INR was sent off. 4. Hypokalemia replace potassium 5. Thrombocytopenia. Likely from alcohol abuse. This may recover the further away from alcohol.  Patient will need to follow-up with primary care physician in [place].  DISCHARGE CONDITIONS:   Fair  CONSULTS OBTAINED:  Treatment Team:  Thana Farr, MD  DRUG ALLERGIES:  No Known Allergies  DISCHARGE MEDICATIONS:   Current Discharge Medication List    START taking these medications   Details  folic acid (FOLVITE) 1 MG tablet Take 1 tablet (1 mg total) by mouth daily. Qty: 30 tablet, Refills: 0    levETIRAcetam (KEPPRA) 500 MG tablet Take 1 tablet (500 mg total) by mouth 2 (two) times daily. Qty: 60 tablet, Refills: 0    LORazepam  (ATIVAN) 1 MG tablet 1 tab po every six hours for two days, 1 tablet every eight hours for two days; 1 tablet every twelve hours for two days; 1 tablet daily for two days Qty: 20 tablet, Refills: 0    Multiple Vitamin (MULTIVITAMIN WITH MINERALS) TABS tablet Take 1 tablet by mouth daily. Qty: 30 tablet, Refills: 0    thiamine 100 MG tablet Take 1 tablet (100 mg total) by mouth daily. Qty: 30 tablet, Refills: 0         DISCHARGE INSTRUCTIONS:   Follow up with doctor in [place] within 3 weeks  If you experience worsening of your admission symptoms, develop shortness of breath, life threatening emergency, suicidal or homicidal thoughts you must seek medical attention immediately by calling 911 or calling your MD immediately  if symptoms less severe.  You Must read complete instructions/literature along with all the possible adverse reactions/side effects for all the Medicines you take and that have been prescribed to you. Take any new Medicines after you have completely understood and accept all the possible adverse reactions/side effects.   Please note  You were cared for by a hospitalist during your hospital stay. If you have any questions about your discharge medications or the care you received while you were in the hospital after you are discharged, you can call the unit and asked to speak with the hospitalist on call if the hospitalist that took care of you  is not available. Once you are discharged, your primary care physician will handle any further medical issues. Please note that NO REFILLS for any discharge medications will be authorized once you are discharged, as it is imperative that you return to your primary care physician (or establish a relationship with a primary care physician if you do not have one) for your aftercare needs so that they can reassess your need for medications and monitor your lab values.    Today   CHIEF COMPLAINT:   Chief Complaint  Patient presents  with  . Seizures    HISTORY OF PRESENT ILLNESS:  Vincent Richards  is a 30 y.o. male presented with a seizure.   VITAL SIGNS:  Blood pressure 128/77, pulse 60, temperature 98 F (36.7 C), temperature source Oral, resp. rate 18, height 5\' 11"  (1.803 m), weight 70.3 kg (155 lb), SpO2 100 %.    PHYSICAL EXAMINATION:  GENERAL:  30 y.o.-year-old patient lying in the bed with no acute distress.  EYES: Pupils equal, round, reactive to light and accommodation. No scleral icterus. Extraocular muscles intact.  HEENT: Head atraumatic, normocephalic. Oropharynx and nasopharynx clear.  NECK:  Supple, no jugular venous distention. No thyroid enlargement, no tenderness.  LUNGS: Normal breath sounds bilaterally, no wheezing, rales,rhonchi or crepitation. No use of accessory muscles of respiration.  CARDIOVASCULAR: S1, S2 normal. No murmurs, rubs, or gallops.  ABDOMEN: Soft, non-tender, non-distended. Bowel sounds present. No organomegaly or mass.  EXTREMITIES: No pedal edema, cyanosis, or clubbing.  NEUROLOGIC: Cranial nerves II through XII are intact. Muscle strength 5/5 in all extremities. Sensation intact. Gait not checked.  PSYCHIATRIC: The patient is alert and oriented x 3.  SKIN: No obvious rash, lesion, or ulcer.   DATA REVIEW:   CBC  Recent Labs Lab 06/01/16 0413  WBC 6.4  HGB 14.9  HCT 43.8  PLT 68*    Chemistries   Recent Labs Lab 06/01/16 0413  NA 138  K 3.3*  CL 107  CO2 24  GLUCOSE 96  BUN 6  CREATININE 0.68  CALCIUM 8.7*     RADIOLOGY:  Ct Head Wo Contrast  Result Date: 05/31/2016 CLINICAL DATA:  Seizure today. EXAM: CT HEAD WITHOUT CONTRAST CT CERVICAL SPINE WITHOUT CONTRAST TECHNIQUE: Multidetector CT imaging of the head and cervical spine was performed following the standard protocol without intravenous contrast. Multiplanar CT image reconstructions of the cervical spine were also generated. COMPARISON:  None. FINDINGS: CT HEAD FINDINGS Brain: No evidence of  acute infarction, hemorrhage, hydrocephalus, extra-axial collection or mass lesion/mass effect. Vascular: No hyperdense vessel or unexpected calcification. Skull: Normal. Negative for fracture or focal lesion. Sinuses/Orbits: No acute finding. Other: Right lateral scalp hematoma. CT CERVICAL SPINE FINDINGS Alignment: Reversal of the normal lordosis.  No subluxations. Skull base and vertebrae: No acute fracture. No primary bone lesion or focal pathologic process. Soft tissues and spinal canal: No prevertebral fluid or swelling. No visible canal hematoma. Disc levels: Minimal may anterior spur formation at the C5-6 and C6-7 levels. Upper chest: Clear lung apices. Other: None. IMPRESSION: 1. Right lateral scalp hematoma without skull fracture or intracranial hemorrhage. 2. No cervical spine fracture or subluxation. 3. Reversal of the normal cervical lordosis. 4. Very minimal degenerative changes at the C5-6 and C6-7 levels. Electronically Signed   By: Beckie SaltsSteven  Reid M.D.   On: 05/31/2016 20:52   Ct Cervical Spine Wo Contrast  Result Date: 05/31/2016 CLINICAL DATA:  Seizure today. EXAM: CT HEAD WITHOUT CONTRAST CT CERVICAL SPINE WITHOUT CONTRAST TECHNIQUE:  Multidetector CT imaging of the head and cervical spine was performed following the standard protocol without intravenous contrast. Multiplanar CT image reconstructions of the cervical spine were also generated. COMPARISON:  None. FINDINGS: CT HEAD FINDINGS Brain: No evidence of acute infarction, hemorrhage, hydrocephalus, extra-axial collection or mass lesion/mass effect. Vascular: No hyperdense vessel or unexpected calcification. Skull: Normal. Negative for fracture or focal lesion. Sinuses/Orbits: No acute finding. Other: Right lateral scalp hematoma. CT CERVICAL SPINE FINDINGS Alignment: Reversal of the normal lordosis.  No subluxations. Skull base and vertebrae: No acute fracture. No primary bone lesion or focal pathologic process. Soft tissues and spinal  canal: No prevertebral fluid or swelling. No visible canal hematoma. Disc levels: Minimal may anterior spur formation at the C5-6 and C6-7 levels. Upper chest: Clear lung apices. Other: None. IMPRESSION: 1. Right lateral scalp hematoma without skull fracture or intracranial hemorrhage. 2. No cervical spine fracture or subluxation. 3. Reversal of the normal cervical lordosis. 4. Very minimal degenerative changes at the C5-6 and C6-7 levels. Electronically Signed   By: Beckie Salts M.D.   On: 05/31/2016 20:52   Mr Brain Wo Contrast  Result Date: 06/01/2016 CLINICAL DATA:  Seizure.  Possible basal ganglia lesion. EXAM: MRI HEAD WITHOUT CONTRAST TECHNIQUE: Multiplanar, multiecho pulse sequences of the brain and surrounding structures were obtained without intravenous contrast. COMPARISON:  Head CT 05/31/2016 FINDINGS: Brain: There is no evidence of acute infarct, mass, midline shift, or extra-axial fluid collection. There is encephalomalacia in the anterior right frontal lobe with associated chronic blood products likely reflecting remote trauma. Dilated perivascular spaces are noted at the inferior aspects of the basal ganglia bilaterally. The mesial temporal lobe structures are symmetric and normal in appearance bilaterally. The ventricles are normal in size. Vascular: Major intracranial vascular flow voids are preserved. Skull and upper cervical spine: Unremarkable bone marrow signal. Sinuses/Orbits: Unremarkable. Other: Small right parietal scalp hematoma as seen on earlier CT. IMPRESSION: 1. No acute intracranial abnormality. 2. 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 3. Small right parietal scalp hematoma. Electronically Signed   By: Sebastian Ache M.D.   On: 06/01/2016 10:37   Dg Hip Unilat With Pelvis 2-3 Views Left  Result Date: 05/31/2016 CLINICAL DATA:  Bilateral hip pain following a seizure today. EXAM: DG HIP (WITH OR WITHOUT PELVIS) 2-3V LEFT COMPARISON:  None. FINDINGS:  There is no evidence of hip fracture or dislocation. There is no evidence of arthropathy or other focal bone abnormality. IMPRESSION: Normal examination. Electronically Signed   By: Beckie Salts M.D.   On: 05/31/2016 20:53   Dg Hip Unilat With Pelvis 2-3 Views Right  Result Date: 05/31/2016 CLINICAL DATA:  Bilateral hip pain following a seizure today. EXAM: DG HIP (WITH OR WITHOUT PELVIS) 2-3V RIGHT COMPARISON:  None. FINDINGS: There is no evidence of hip fracture or dislocation. There is no evidence of arthropathy or other focal bone abnormality. IMPRESSION: Normal examination. Electronically Signed   By: Beckie Salts M.D.   On: 05/31/2016 20:53    Management plans discussed with the patient, family and they are in agreement.  CODE STATUS:     Code Status Orders        Start     Ordered   06/01/16 0120  Full code  Continuous     06/01/16 0119    Code Status History    Date Active Date Inactive Code Status Order ID Comments User Context   This patient has a current code status but no historical code status.  TOTAL TIME TAKING CARE OF THIS PATIENT: 35 minutes.    Alford Highland M.D on 06/01/2016 at 3:58 PM  Between 7am to 6pm - Pager - [phone]  After 6pm go to [email or web address] - password [email or web address]  [facility] Office  [phone]  CC: Primary care physician; Pcp Not In System

## 2018-02-13 IMAGING — CT CT CERVICAL SPINE W/O CM
4 of 7 series · 14 of 33 positions shown, 15 images · non-contrast
Comparison: None.

CLINICAL DATA: Seizure today.

EXAM:
CT HEAD WITHOUT CONTRAST
CT CERVICAL SPINE WITHOUT CONTRAST
TECHNIQUE: Multidetector CT imaging of the head and cervical spine was
performed following the standard protocol without intravenous
contrast. Multiplanar CT image reconstructions of the cervical spine
were also generated.

[Series 4: coronal soft tissue · coronal · 0.28mm/px · 3 of 70 slices shown]
[im 18/70  bone]
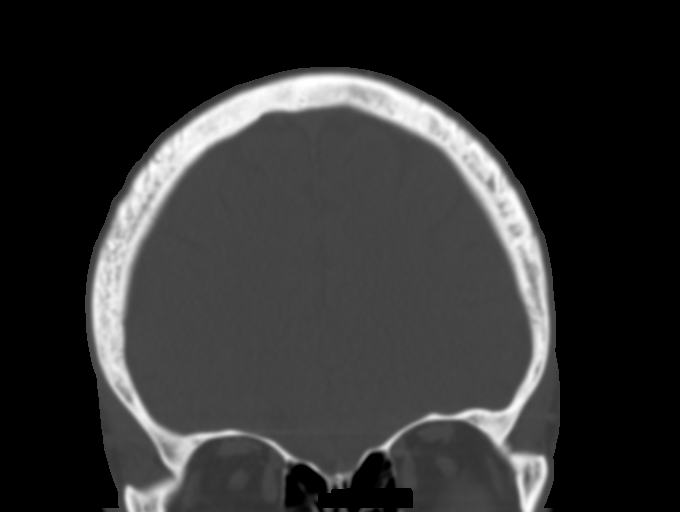
[im 35/70  bone]
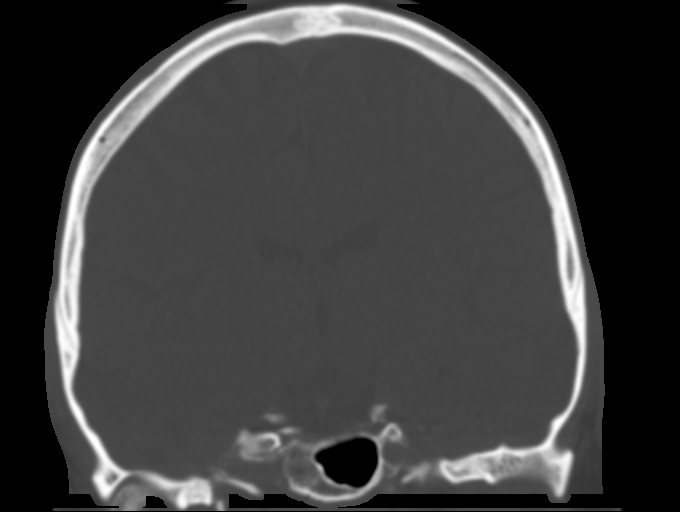
[im 52/70  bone]
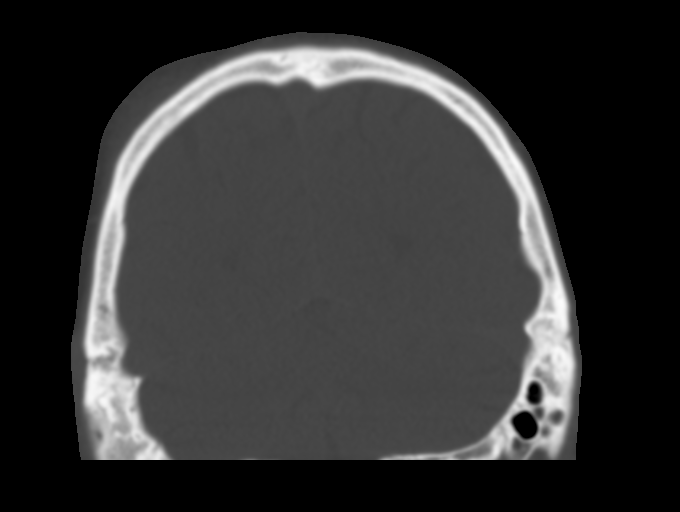

[Series 7: c spine soft · axial · 0.33mm/px · z∈[-214,-174]mm · 2 of 101 slices shown]
[im 21/101  soft-tissue]
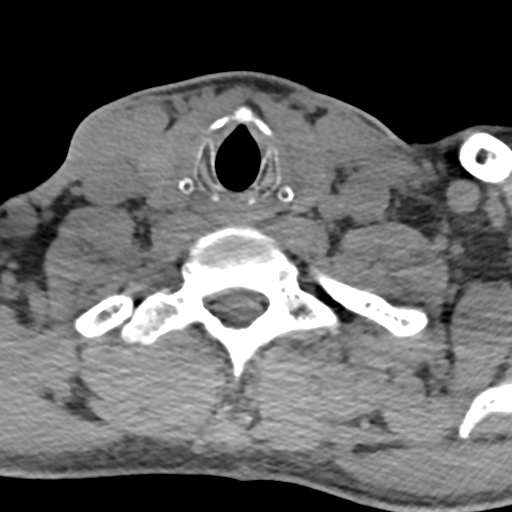
[im 41/101  soft-tissue]
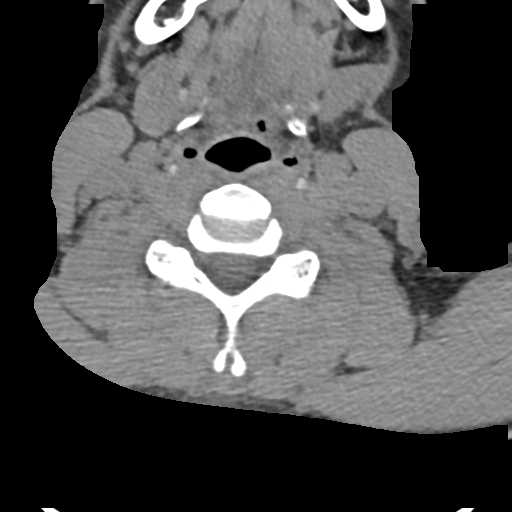

[Series 8: sagittal bone · sagittal · 0.29mm/px · 5 of 56 slices shown]
[im 10/56  bone]
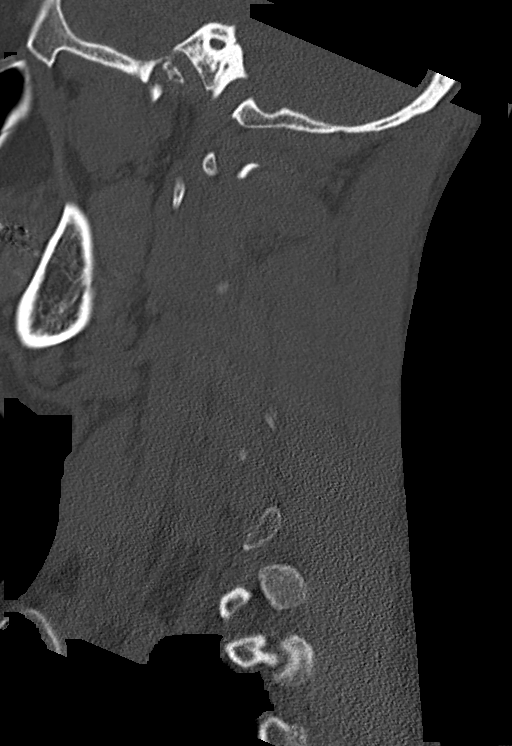
[im 19/56  bone]
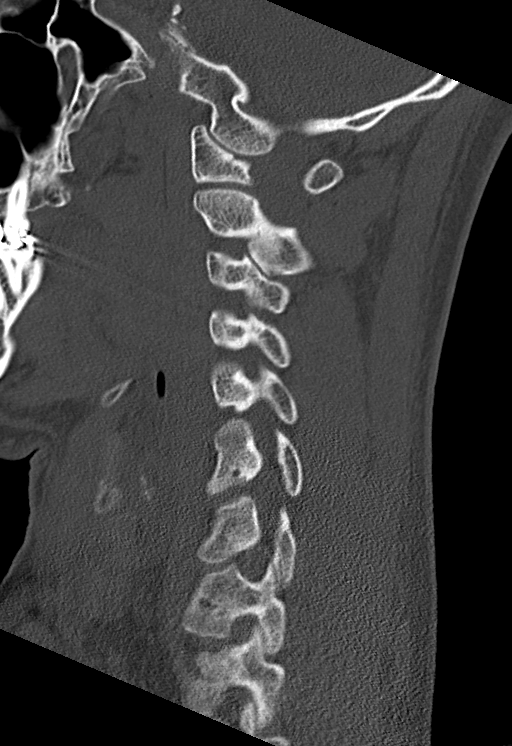
[im 28/56  bone]
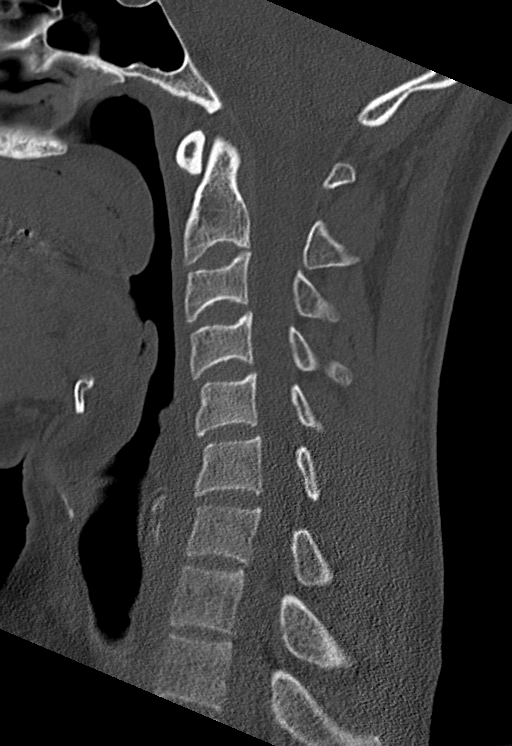
[im 37/56  bone]
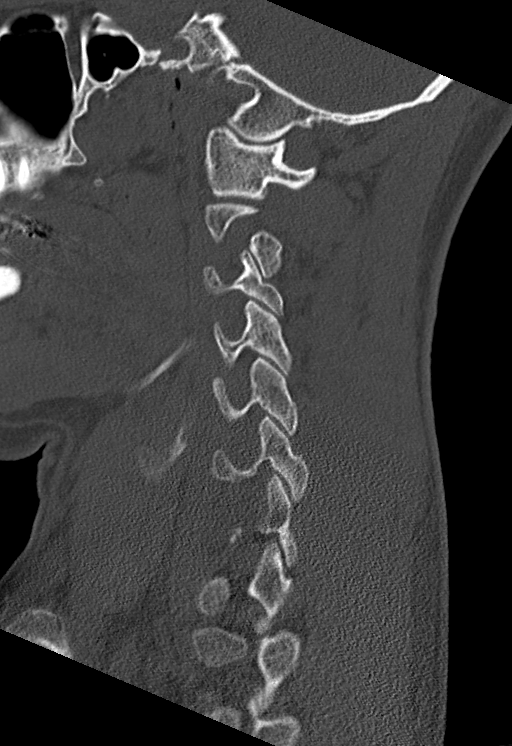
[im 46/56  bone]
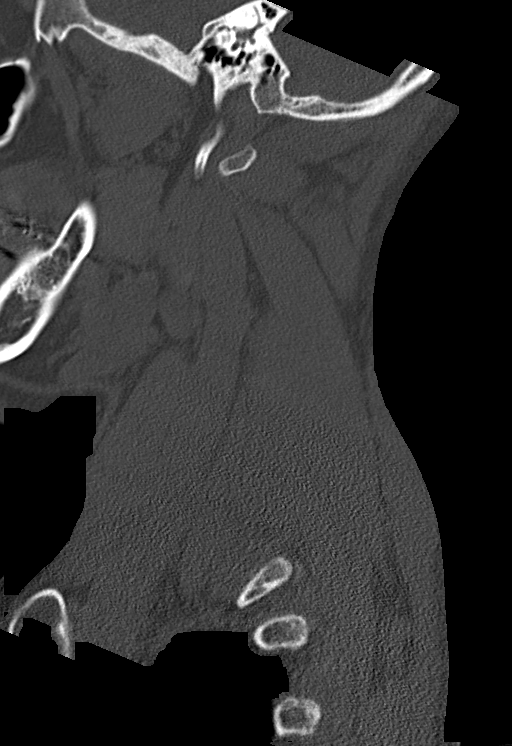

[Series 10: orthogonal bone · axial · 0.23mm/px · z∈[-236,-120]mm · 4 of 105 slices shown, 5 images]
[im 21/105  soft-tissue]
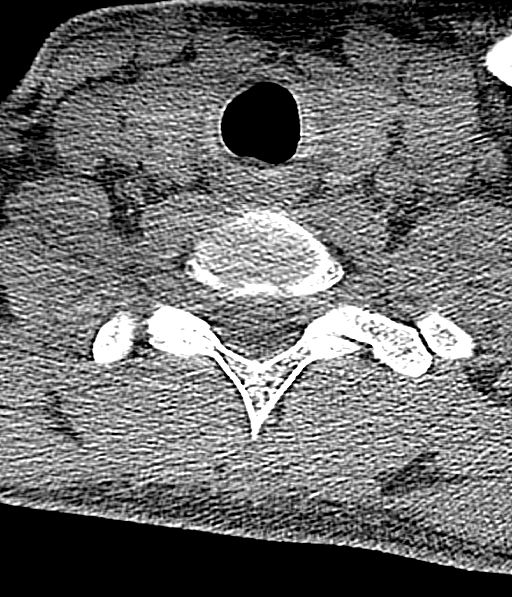
[im 21/105  bone]
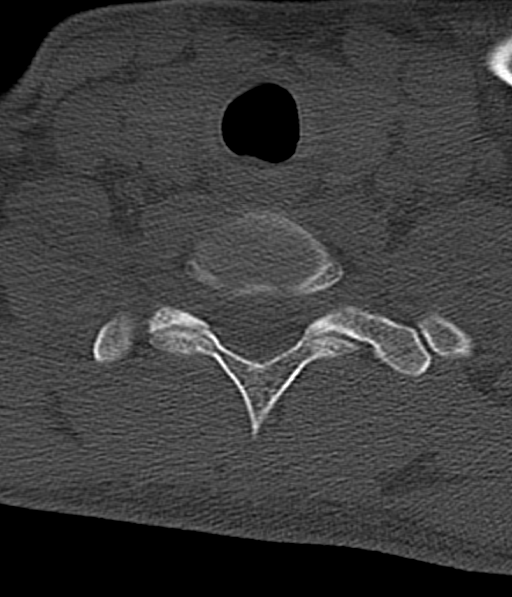
[im 42/105  bone]
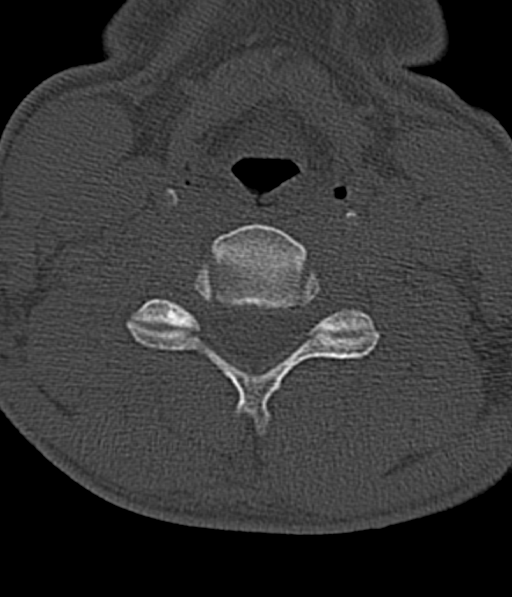
[im 63/105  bone]
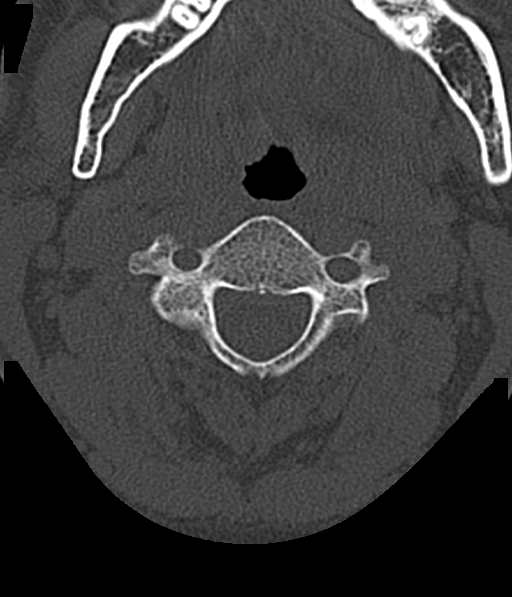
[im 84/105  bone]
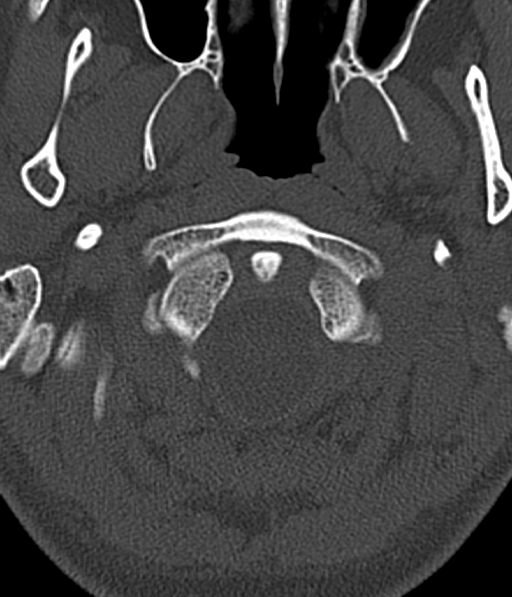

[14 of 33 positions shown; findings below may reference images not displayed]

FINDINGS: CT HEAD FINDINGS

Brain: No evidence of acute infarction, hemorrhage, hydrocephalus,
extra-axial collection or mass lesion/mass effect.

Vascular: No hyperdense vessel or unexpected calcification.

Skull: Normal. Negative for fracture or focal lesion.

Sinuses/Orbits: No acute finding.

Other: Right lateral scalp hematoma.

CT CERVICAL SPINE FINDINGS

Alignment: Reversal of the normal lordosis.  No subluxations.

Skull base and vertebrae: No acute fracture. No primary bone lesion
or focal pathologic process.

Soft tissues and spinal canal: No prevertebral fluid or swelling. No
visible canal hematoma.

Disc levels: Minimal may anterior spur formation at the C5-6 and
C6-7 levels.

Upper chest: Clear lung apices.

Other: None.
IMPRESSION: 1. Right lateral scalp hematoma without skull fracture or
intracranial hemorrhage.
2. No cervical spine fracture or subluxation.
3. Reversal of the normal cervical lordosis.
4. Very minimal degenerative changes at the C5-6 and C6-7 levels.
